# Patient Record
Sex: Female | Born: 1995 | Race: White | Hispanic: No | Marital: Single | Smoking: Never smoker
Health system: Southern US, Community
[De-identification: ages and names within clinical notes are randomized; demographics above are authoritative.]

## PROBLEM LIST (undated history)

## (undated) DIAGNOSIS — Z9889 Other specified postprocedural states: Secondary | ICD-10-CM

## (undated) DIAGNOSIS — T8489XA Other specified complication of internal orthopedic prosthetic devices, implants and grafts, initial encounter: Secondary | ICD-10-CM

## (undated) DIAGNOSIS — E739 Lactose intolerance, unspecified: Secondary | ICD-10-CM

## (undated) DIAGNOSIS — R112 Nausea with vomiting, unspecified: Secondary | ICD-10-CM

## (undated) DIAGNOSIS — S83207A Unspecified tear of unspecified meniscus, current injury, left knee, initial encounter: Secondary | ICD-10-CM

## (undated) HISTORY — PX: KNEE ARTHROSCOPY WITH ANTERIOR CRUCIATE LIGAMENT (ACL) REPAIR: SHX5644

---
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
## 2017-09-15 ENCOUNTER — Other Ambulatory Visit: Payer: Self-pay

## 2017-09-15 ENCOUNTER — Ambulatory Visit (INDEPENDENT_AMBULATORY_CARE_PROVIDER_SITE_OTHER): Payer: Self-pay | Admitting: Family Medicine

## 2017-09-15 ENCOUNTER — Encounter: Payer: Self-pay | Admitting: Family Medicine

## 2017-09-15 VITALS — BP 108/68 | HR 93 | Temp 98.7°F | Resp 16 | Ht 64.17 in | Wt 129.4 lb

## 2017-09-15 DIAGNOSIS — Z020 Encounter for examination for admission to educational institution: Secondary | ICD-10-CM

## 2017-09-15 NOTE — Patient Instructions (Addendum)
  HPV vaccine $285 Meningococcal vaccine $210 Plus administration fees   IF you received an x-ray today, you will receive an invoice from [facility]. Please contact [facility] at [phone] with questions or concerns regarding your invoice.   IF you received labwork today, you will receive an invoice from [facility]. Please contact [facility] at [phone] with questions or concerns regarding your invoice.   Our billing staff will not be able to assist you with questions regarding bills from these companies.  You will be contacted with the lab results as soon as they are available. The fastest way to get your results is to activate your My Chart account. Instructions are located on the last page of this paperwork. If you have not heard from us regarding the results in 2 weeks, please contact this office.

## 2017-09-15 NOTE — Progress Notes (Signed)
7/22/20199:57 AM  Burnard Bunting [birth year], 22 y.o. female [id number]  Chief Complaint  Patient presents with  . New Patient (Initial Visit)    college cpe    HPI:   Patient is a 22 y.o. female who presents today for CPE for PTA program for [employer] She reports doing well Has no acute concerns Immunization records reviewed Discussed [facility] for needed vaccines, patient is self pay Will need seasonal flu vaccine  Will need TB test in Jan 2020 prior to starting clinicals On OCPs, happy with this method  No flowsheet data found.   Depression screen [facility] 2/9 09/15/2017  Decreased Interest 0  Down, Depressed, Hopeless 0  PHQ - 2 Score 0    Allergies  Allergen Reactions  . Adhesive [Tape] Rash    Prior to Admission medications   Medication Sig Start Date End Date Taking? Authorizing Provider  norgestimate-ethinyl estradiol (ORTHO-CYCLEN,SPRINTEC,PREVIFEM) 0.25-35 MG-MCG tablet Take 1 tablet by mouth daily.   Yes [provider]    Past Medical History:  Diagnosis Date  . ACL graft tear (HCC)    left   . Acute meniscal tear of left knee   . Lactose intolerance   . PONV (postoperative nausea and vomiting)     Past Surgical History:  Procedure Laterality Date  . ANTERIOR CRUCIATE LIGAMENT REPAIR Left 05/23/2015   Procedure: LEFT KNEE ARTHROSCOPY REVISION AUTOGRAFT ACL RECONSTRUCTION;  Surgeon: Eugenia Mcalpine, MD;  Location: [facility];  Service: Orthopedics;  Laterality: Left;  . KNEE ARTHROSCOPY Right 06/14/2014   Procedure: RIGHT KNEE SCOPE WITH PLICA RESECTION;  Surgeon: Eugenia Mcalpine, MD;  Location: [facility];  Service: Orthopedics;  Laterality: Right;  . KNEE ARTHROSCOPY WITH ANTERIOR CRUCIATE LIGAMENT (ACL) REPAIR Left 2013 (age 72)  . MENISECTOMY Right 06/14/2014   Procedure: PARTIAL MENISECTOMY VS REPAIR;  Surgeon: Eugenia Mcalpine, MD;  Location: [facility];  Service: Orthopedics;  Laterality: Right;    Social  History   Tobacco Use  . Smoking status: Never Smoker  . Smokeless tobacco: Never Used  Substance Use Topics  . Alcohol use: No    History reviewed. No pertinent family history.  Review of Systems  Constitutional: Negative for chills and fever.  Respiratory: Negative for cough and shortness of breath.   Cardiovascular: Negative for chest pain, palpitations and leg swelling.  Gastrointestinal: Negative for abdominal pain, nausea and vomiting.  All other systems reviewed and are negative.    OBJECTIVE:  Blood pressure 108/68, pulse 93, temperature 98.7 F (37.1 C), temperature source Oral, resp. rate 16, height 5' 4.17" (1.63 m), weight 129 lb 6.4 oz (58.7 kg), last menstrual period 08/25/2017, SpO2 100 %. Body mass index is 22.09 kg/m.   No exam data present  Physical Exam  Constitutional: She is oriented to person, place, and time. She appears well-developed and well-nourished.  HENT:  Head: Normocephalic and atraumatic.  Right Ear: Hearing, tympanic membrane, external ear and ear canal normal.  Left Ear: Hearing, tympanic membrane, external ear and ear canal normal.  Mouth/Throat: Oropharynx is clear and moist.  Eyes: Pupils are equal, round, and reactive to light. Conjunctivae and EOM are normal.  Neck: Neck supple. No thyromegaly present.  Cardiovascular: Normal rate, regular rhythm, normal heart sounds and intact distal pulses. Exam reveals no gallop and no friction rub.  No murmur heard. Pulmonary/Chest: Effort normal and breath sounds normal. She has no wheezes. She has no rales.  Abdominal: Soft. Bowel sounds are normal. She exhibits no  distension and no mass. There is no tenderness.  Musculoskeletal: Normal range of motion. She exhibits no edema.  Lymphadenopathy:    She has no cervical adenopathy.  Neurological: She is alert and oriented to person, place, and time. No cranial nerve deficit. Gait normal.  Reflex Scores:      Patellar reflexes are 1+ on the right  side and 3+ on the left side. Skin: Skin is warm and dry.  Psychiatric: She has a normal mood and affect.  Nursing note and vitals reviewed.   ASSESSMENT and PLAN  1. School physical exam Patient with no concerns on history or exam that would limit participation in PTA prgram Paperwork completed.  Return if symptoms worsen or fail to improve.    Myles LippsIrma M Santiago, MD [facility] at [facility] [address] [place], [place] [address] Ph.  [phone] Fax [phone]

## 2019-09-02 ENCOUNTER — Other Ambulatory Visit: Payer: Self-pay | Admitting: Physician Assistant

## 2019-09-02 DIAGNOSIS — M25561 Pain in right knee: Secondary | ICD-10-CM

## 2019-09-05 ENCOUNTER — Other Ambulatory Visit: Payer: Self-pay

## 2019-09-05 ENCOUNTER — Ambulatory Visit (INDEPENDENT_AMBULATORY_CARE_PROVIDER_SITE_OTHER): Payer: Self-pay

## 2019-09-05 DIAGNOSIS — M25561 Pain in right knee: Secondary | ICD-10-CM

## 2020-11-24 IMAGING — MR MR KNEE*R* W/O CM
7 series · 40 of 40 positions shown · non-contrast
Comparison: None.

CLINICAL DATA: Right knee pain.

EXAM:
MRI OF THE RIGHT KNEE WITHOUT CONTRAST
TECHNIQUE: Multiplanar, multisequence MR imaging of the knee was performed. No
intravenous contrast was administered.

[Series 7: T1 · coronal · 4.0mm · 0.62mm/px · 6 of 29 slices shown]
[im 1/29]
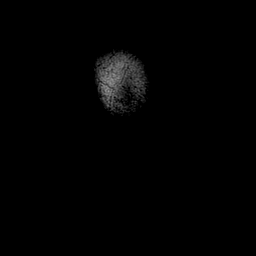
[im 6/29]
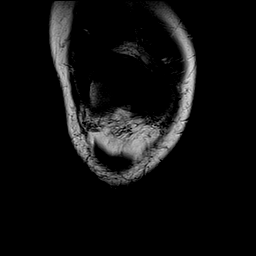
[im 12/29]
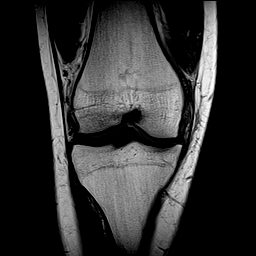
[im 17/29]
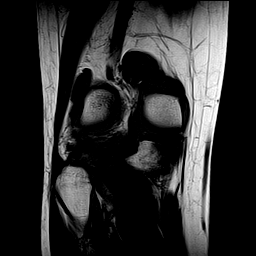
[im 23/29]
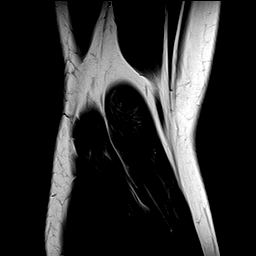
[im 29/29]
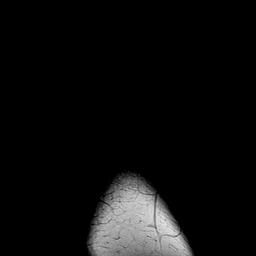

[Series 8: T2 fat-sat · coronal · 4.0mm · 0.62mm/px · 6 of 29 slices shown (1 of 3)]
[im 1/29]
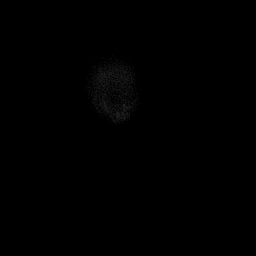
[im 6/29]
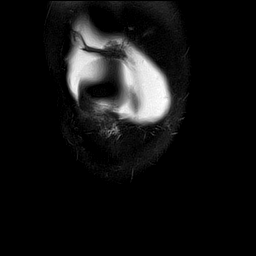
[im 12/29]
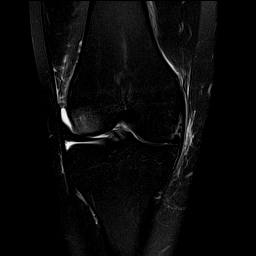
[im 17/29]
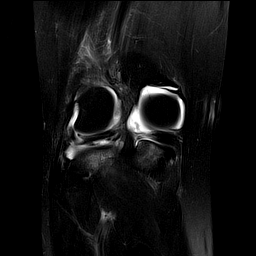
[im 23/29]
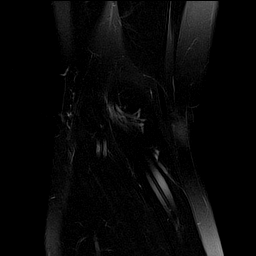
[im 29/29]
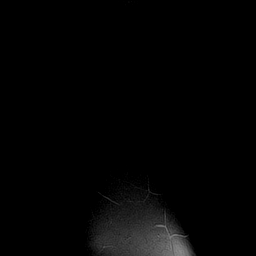

[Series 9: PD fat-sat · sagittal · 3.0mm · 0.62mm/px · 6 of 27 slices shown (1 of 3)]
[im 1/27]
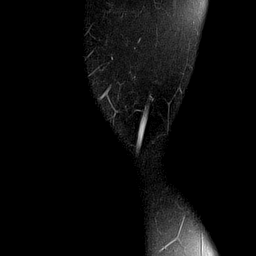
[im 6/27]
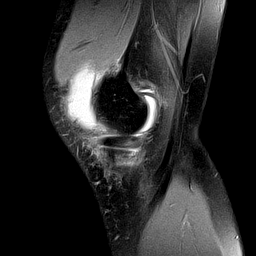
[im 11/27]
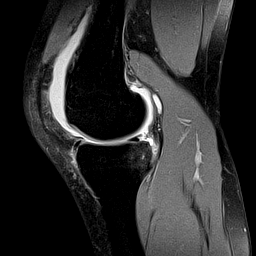
[im 16/27]
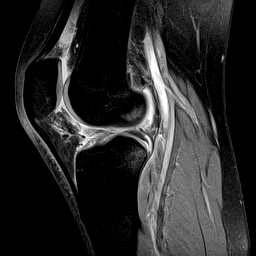
[im 21/27]
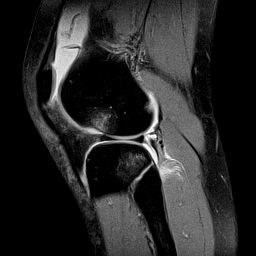
[im 27/27]
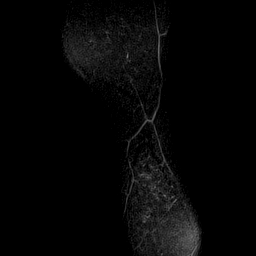

[Series 10: PD fat-sat · coronal · 4.0mm · 0.62mm/px · 6 of 29 slices shown (2 of 3)]
[im 1/29]
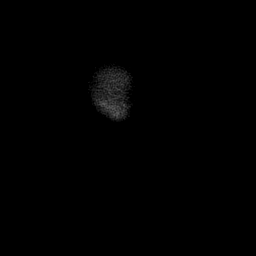
[im 6/29]
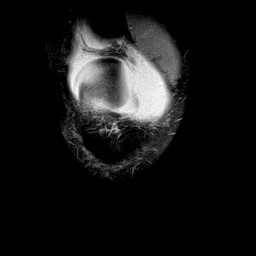
[im 12/29]
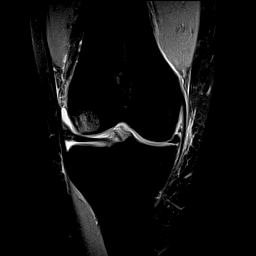
[im 17/29]
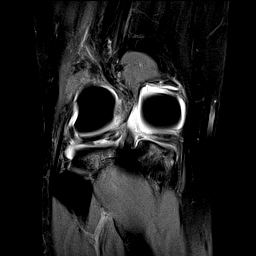
[im 23/29]
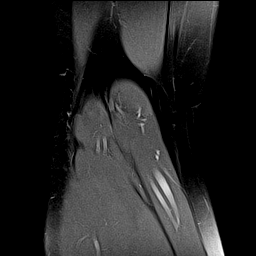
[im 29/29]
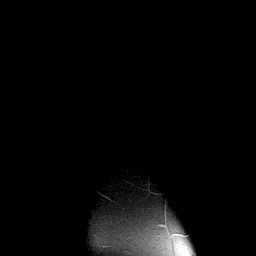

[Series 11: T2 fat-sat · sagittal · 3.0mm · 0.62mm/px · 5 of 26 slices shown (2 of 3)]
[im 1/26]
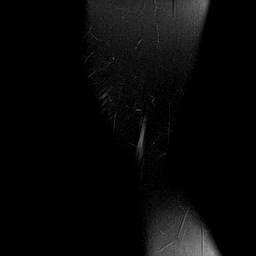
[im 7/26]
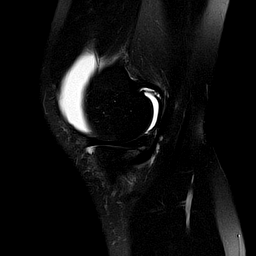
[im 13/26]
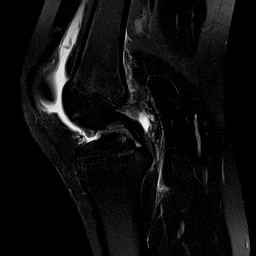
[im 19/26]
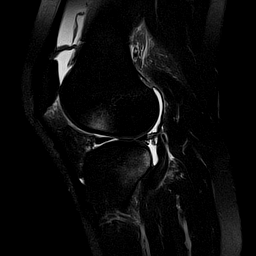
[im 26/26]
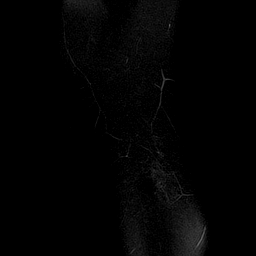

[Series 12: PD fat-sat · coronal · 2.0mm · 0.62mm/px · 4 of 19 slices shown (3 of 3)]
[im 1/19]
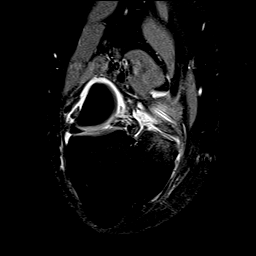
[im 7/19]
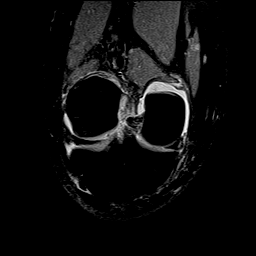
[im 13/19]
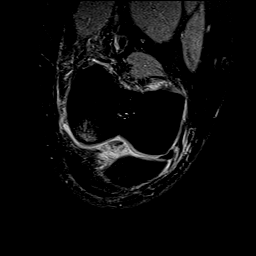
[im 19/19]
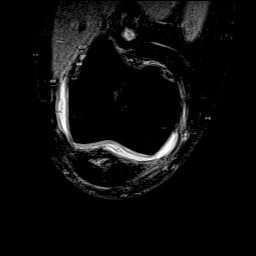

[Series 13: T2 fat-sat · axial · 4.0mm · 0.53mm/px · z∈[-60,+94]mm · 7 of 32 slices shown (3 of 3)]
[im 1/32]
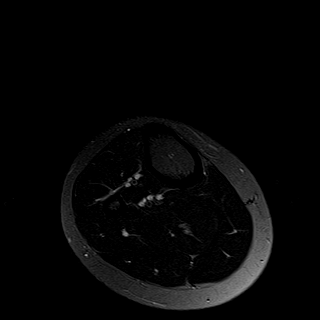
[im 6/32]
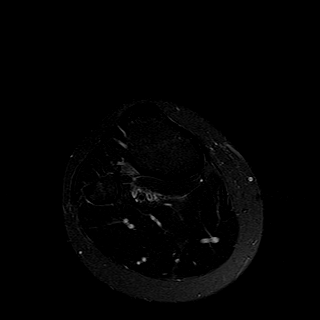
[im 11/32]
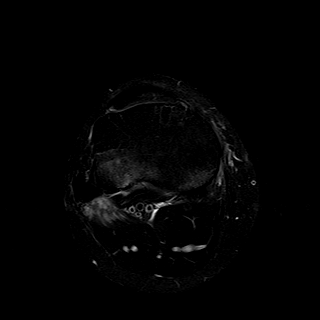
[im 16/32]
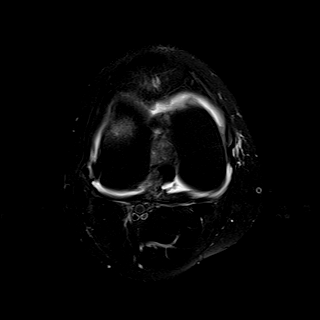
[im 21/32]
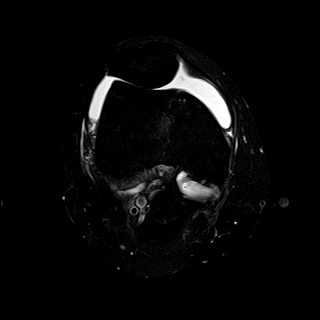
[im 26/32]
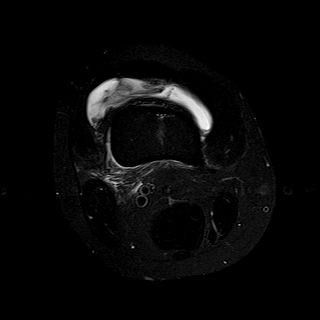
[im 32/32]
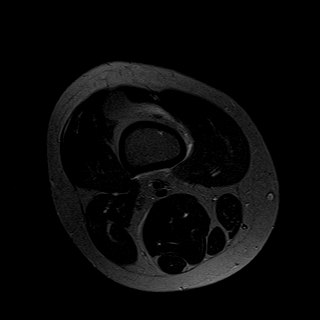

[40 of 40 positions shown; findings below may reference images not displayed]

FINDINGS: MENISCI

Medial meniscus: Vertical tear of the posterior periphery of the
posterior horn of medial meniscus adjacent to the meniscal capsular
junction.

Lateral meniscus: Partial radial tear of the root of the posterior
horn of the lateral meniscus.

LIGAMENTS

Cruciates:  Complete ACL tear. Intact PCL.

Collaterals: Lateral collateral ligament complex is intact. Mild
edema superficial to the MCL most consistent with mild MCL strain
without a tear.

CARTILAGE

Patellofemoral:  No chondral defect.

Medial:  No chondral defect.

Lateral:  No chondral defect.

Joint: Large joint effusion. Mild edema in Hoffa's fat. No plical
thickening. Intact joint capsule.

Popliteal Fossa:  No Baker cyst. Intact popliteus tendon.

Extensor Mechanism: Intact quadriceps tendon. Intact patellar
tendon. Intact medial patellar retinaculum. Intact lateral patellar
retinaculum. Intact MPFL.

Bones: Osseous contusions of the anterolateral femoral condyle,
posterolateral tibial plateau and posteromedial tibial plateau. No
fracture or dislocation. No aggressive osseous lesion.

Other: Mild edema in the proximal most aspect of the soleus muscle
likely reflecting a partial tear.
IMPRESSION: 1. Complete ACL tear.
2. Vertical tear of the posterior periphery of the posterior horn of
medial meniscus adjacent to the meniscal capsular junction.
3. Partial radial tear of the root of the posterior horn of the
lateral meniscus.
4. Osseous contusions of the anterolateral femoral condyle,
posterolateral tibial plateau and posteromedial tibial plateau.
5. Mild MCL strain.
6. Large joint effusion.
7. Small partial tear of the origin of the soleus muscle.
